# Patient Record
Sex: Male | Born: 1975 | Race: Black or African American | Hispanic: No | Marital: Married | Smoking: Former smoker
Health system: Southern US, Community
[De-identification: ages and names within clinical notes are randomized; demographics above are authoritative.]

## PROBLEM LIST (undated history)

## (undated) DIAGNOSIS — K649 Unspecified hemorrhoids: Secondary | ICD-10-CM

## (undated) DIAGNOSIS — R7611 Nonspecific reaction to tuberculin skin test without active tuberculosis: Secondary | ICD-10-CM

## (undated) HISTORY — DX: Unspecified hemorrhoids: K64.9

## (undated) HISTORY — DX: Nonspecific reaction to tuberculin skin test without active tuberculosis: R76.11

---

## 2005-07-26 ENCOUNTER — Emergency Department: Payer: Self-pay | Admitting: Emergency Medicine

## 2005-09-30 ENCOUNTER — Other Ambulatory Visit: Payer: Self-pay

## 2005-09-30 ENCOUNTER — Emergency Department: Payer: Self-pay | Admitting: Emergency Medicine

## 2010-12-26 ENCOUNTER — Emergency Department: Payer: Self-pay | Admitting: Emergency Medicine

## 2012-07-29 ENCOUNTER — Ambulatory Visit (INDEPENDENT_AMBULATORY_CARE_PROVIDER_SITE_OTHER): Payer: 59 | Admitting: Family Medicine

## 2012-07-29 ENCOUNTER — Encounter: Payer: Self-pay | Admitting: Family Medicine

## 2012-07-29 VITALS — BP 120/74 | HR 80 | Temp 98.1°F | Ht 71.0 in | Wt 229.8 lb

## 2012-07-29 DIAGNOSIS — Z131 Encounter for screening for diabetes mellitus: Secondary | ICD-10-CM

## 2012-07-29 DIAGNOSIS — Z13220 Encounter for screening for lipoid disorders: Secondary | ICD-10-CM

## 2012-07-29 DIAGNOSIS — Z0000 Encounter for general adult medical examination without abnormal findings: Secondary | ICD-10-CM

## 2012-07-29 LAB — CBC WITH DIFFERENTIAL/PLATELET
Basophils Absolute: 0 10*3/uL (ref 0.0–0.1)
HCT: 39.1 % (ref 39.0–52.0)
Hemoglobin: 12.9 g/dL — ABNORMAL LOW (ref 13.0–17.0)
Lymphs Abs: 2 10*3/uL (ref 0.7–4.0)
MCV: 92.7 fl (ref 78.0–100.0)
Monocytes Absolute: 0.4 10*3/uL (ref 0.1–1.0)
Neutro Abs: 3.4 10*3/uL (ref 1.4–7.7)
Platelets: 194 10*3/uL (ref 150.0–400.0)
RDW: 12.8 % (ref 11.5–14.6)

## 2012-07-29 LAB — BASIC METABOLIC PANEL
BUN: 12 mg/dL (ref 6–23)
CO2: 28 mEq/L (ref 19–32)
Chloride: 108 mEq/L (ref 96–112)
Glucose, Bld: 85 mg/dL (ref 70–99)
Potassium: 3.9 mEq/L (ref 3.5–5.1)
Sodium: 139 mEq/L (ref 135–145)

## 2012-07-29 NOTE — Progress Notes (Signed)
Nature conservation officer at [facility] [address] [place] [address] Phone: [phone] Fax: [phone]  Date:  07/29/2012   Name:  Michael Martin   DOB:  11-13-1975   MRN:  [id number] Gender: male Age: 37 y.o.  Primary Physician:  Hannah Beat, MD  Evaluating MD: Hannah Beat, MD  Chief Complaint: Establish Care   History of Present Illness:  Michael Martin is a 37 y.o. very pleasant male patient who presents with the following:  CPX:  Some intermittently blood in stool - more with pressure.  Has been going on for years.   Preventative Health Maintenance Visit:  Health Maintenance Summary Reviewed and updated, unless pt declines services.  Tobacco History Reviewed. 3 cigs a day Alcohol: No concerns, no excessive use Exercise Habits: Some activity, rec at least 30 mins 5 times a week STD concerns: no risk or activity to increase risk Drug Use: None Encouraged self-testicular check   Past Medical History, Surgical History, Social History, Family History, Problem List, Medications, and Allergies have been reviewed and updated if relevant.  No current outpatient prescriptions on file prior to visit.   No current facility-administered medications on file prior to visit.    Review of Systems:  General: Denies fever, chills, sweats. No significant weight loss. Eyes: Denies blurring,significant itching ENT: Denies earache, sore throat, and hoarseness. Cardiovascular: Denies chest pains, palpitations, dyspnea on exertion Respiratory: Denies cough, dyspnea at rest,wheeezing Breast: no concerns about lumps GI: Denies nausea, vomiting, diarrhea, constipation, change in bowel habits, abdominal pain. OCC BRBPR GU: Denies penile discharge, ED, urinary flow / outflow problems. No STD concerns. Musculoskeletal: Denies back pain, joint pain Derm: Denies rash, itching Neuro: Denies  paresthesias, frequent falls, frequent headaches Psych: Denies depression,  anxiety Endocrine: Denies cold intolerance, heat intolerance, polydipsia Heme: Denies enlarged lymph nodes Allergy: No hayfever   Physical Examination: BP 120/74  Pulse 80  Temp(Src) 98.1 F (36.7 C) (Oral)  Ht 5\' 11"  (1.803 m)  Wt 229 lb 12 oz (104.214 kg)  BMI 32.06 kg/m2  SpO2 97%   Wt Readings from Last 3 Encounters:  07/29/12 229 lb 12 oz (104.214 kg)    GEN: well developed, well nourished, no acute distress Eyes: conjunctiva and lids normal, PERRLA, EOMI ENT: TM clear, nares clear, oral exam WNL Neck: supple, no lymphadenopathy, no thyromegaly, no JVD Pulm: clear to auscultation and percussion, respiratory effort normal CV: regular rate and rhythm, S1-S2, no murmur, rub or gallop, no bruits, peripheral pulses normal and symmetric, no cyanosis, clubbing, edema or varicosities Chest: no scars, masses GI: soft, non-tender; no hepatosplenomegaly, masses; active bowel sounds all quadrants. HEMORRHOIDS SMALL GU: no hernia, testicular mass, penile discharge Lymph: no cervical, axillary or inguinal adenopathy MSK: gait normal, muscle tone and strength WNL, no joint swelling, effusions, discoloration, crepitus  SKIN: clear, good turgor, color WNL, no rashes, lesions, or ulcerations Neuro: normal mental status, normal strength, sensation, and motion Psych: alert; oriented to person, place and time, normally interactive and not anxious or depressed in appearance.   Assessment and Plan:  Routine general medical examination at a health care facility - Plan: CBC with Differential  Screening for lipoid disorders - Plan: LDL cholesterol, direct  Screening for diabetes mellitus - Plan: Basic metabolic panel  The patient's preventative maintenance and recommended screening tests for an annual wellness exam were reviewed in full today. Brought up to date unless services declined.  Counselled on the importance of diet, exercise, and its role in overall health and  mortality. The  patient's FH and SH was reviewed, including their home life, tobacco status, and drug and alcohol status.   Overall, doing well.  Orders Today:  Orders Placed This Encounter  Procedures  . Basic metabolic panel  . CBC with Differential  . LDL cholesterol, direct    Updated Medication List: (Includes new medications, updates to list, dose adjustments) No orders of the defined types were placed in this encounter.    Medications Discontinued: There are no discontinued medications.    Signed, Elpidio Galea. Aniyia Rane, MD 07/29/2012 2:14 PM

## 2012-07-30 ENCOUNTER — Encounter: Payer: Self-pay | Admitting: *Deleted

## 2012-07-30 LAB — LDL CHOLESTEROL, DIRECT: Direct LDL: 50.7 mg/dL

## 2012-11-28 ENCOUNTER — Other Ambulatory Visit: Payer: Self-pay

## 2014-04-16 ENCOUNTER — Emergency Department: Payer: Self-pay | Admitting: Emergency Medicine

## 2016-01-15 ENCOUNTER — Emergency Department: Payer: Self-pay

## 2016-01-15 ENCOUNTER — Encounter: Payer: Self-pay | Admitting: Medical Oncology

## 2016-01-15 ENCOUNTER — Emergency Department
Admission: EM | Admit: 2016-01-15 | Discharge: 2016-01-15 | Disposition: A | Discharge status: Home / Self Care | Payer: Self-pay | Attending: Emergency Medicine | Admitting: Emergency Medicine

## 2016-01-15 DIAGNOSIS — J329 Chronic sinusitis, unspecified: Secondary | ICD-10-CM | POA: Insufficient documentation

## 2016-01-15 DIAGNOSIS — R51 Headache: Secondary | ICD-10-CM

## 2016-01-15 DIAGNOSIS — F17210 Nicotine dependence, cigarettes, uncomplicated: Secondary | ICD-10-CM | POA: Insufficient documentation

## 2016-01-15 DIAGNOSIS — R519 Headache, unspecified: Secondary | ICD-10-CM

## 2016-01-15 MED ORDER — OXYMETAZOLINE HCL 0.05 % NA SOLN
2.0000 | Freq: Once | NASAL | Status: AC
  Administered 2016-01-15: 2 via NASAL
  Filled 2016-01-15: qty 15

## 2016-01-15 MED ORDER — KETOROLAC TROMETHAMINE 60 MG/2ML IM SOLN
60.0000 mg | Freq: Once | INTRAMUSCULAR | Status: AC
  Administered 2016-01-15: 60 mg via INTRAMUSCULAR

## 2016-01-15 MED ORDER — AMOXICILLIN-POT CLAVULANATE 875-125 MG PO TABS
1.0000 | ORAL_TABLET | Freq: Once | ORAL | Status: AC
  Administered 2016-01-15: 1 via ORAL
  Filled 2016-01-15: qty 1

## 2016-01-15 MED ORDER — AMOXICILLIN-POT CLAVULANATE 875-125 MG PO TABS: 1.0000 | ORAL_TABLET | Freq: Two times a day (BID) | ORAL | 0 refills | Status: AC

## 2016-01-15 MED ORDER — METOCLOPRAMIDE HCL 10 MG PO TABS
10.0000 mg | ORAL_TABLET | Freq: Once | ORAL | Status: AC
  Administered 2016-01-15: 10 mg via ORAL
  Filled 2016-01-15: qty 1

## 2016-01-15 NOTE — ED Triage Notes (Signed)
Pt reports headache x 3 days, no hx of migraines. Pt reports he feels like the left side of his face is swollen. Pt denies NV.

## 2016-01-15 NOTE — ED Notes (Signed)
Patient states that he has had a headache for the last 3 days. Described the pain as a constant pain like someone has hit him in the face. Patient states the pain is in the left cheek bone, around the left eye socket, left frontal area and back. Patient sat up to get jacket off and said the pain shot from the front to the back of his head. Patient states he has had some blurred vision and unsteady gait. Patient has not taken anything for the pain because he does not like to take medications. Patient denies sensitivity to light and sound. Has sensitivity to touch.

## 2016-01-15 NOTE — ED Provider Notes (Signed)
[facility] Emergency Department Provider Note  Time seen: 4:40 PM  I have reviewed the triage vital signs and the nursing notes.   HISTORY  Chief Complaint Headache    HPI Michael Martin is a 40 y.o. male with no past medical history who presents to the emergency department with a left-sided headache for the past 3 days. According to the patient for the past 3 days he has been expressing pain under his left eye, with occasional sharp pain shooting into his head. States this area is very tender to the touch. Denies any recent illnesses, fever, cough, congestion. Denies any weakness or numbness. Describes his symptoms currently as moderate but will occasionally have more significant shooting type pains.  Past Medical History:  Diagnosis Date  . Hemorrhoids   . 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    There are no active problems to display for this patient.   No past surgical history on file.  Prior to Admission medications   Not on File    No Known Allergies  No family history on file.  Social History Social History  Substance Use Topics  . Smoking status: Current Some Day Smoker    Packs/day: 0.20    Types: Cigarettes  . Smokeless tobacco: Never Used  . Alcohol use Yes    Review of Systems Constitutional: Negative for fever. Eyes: Negative for visual changes. Cardiovascular: Negative for chest pain. Respiratory: Negative for shortness of breath. Gastrointestinal: Negative for abdominal pain, states diarrhea earlier this week. Neurological: Moderate left-sided headache. Denies focal weakness or numbness. 10-point ROS otherwise negative.  ____________________________________________   PHYSICAL EXAM:  VITAL SIGNS: ED Triage Vitals  Enc Vitals Group     BP 01/15/16 1505 114/89     Pulse Rate 01/15/16 1505 86     Resp 01/15/16 1505 18     Temp 01/15/16 1505 98.2 F (36.8 C)     Temp Source 01/15/16 1505 Oral     SpO2 01/15/16 1505  98 %     Weight 01/15/16 1506 230 lb (104.3 kg)     Height 01/15/16 1506 5\' 10"  (1.778 m)     Head Circumference --      Peak Flow --      Pain Score 01/15/16 1506 10     Pain Loc --      Pain Edu? --      Excl. in GC? --     Constitutional: Alert and oriented. Well appearing and in no distress.Keeps eyes closed through most of the exam. Eyes: Normal exam, slight photophobia, 2-3 mm bilateral Perrl. Extraocular muscles intact. ENT   Head: Normocephalic and atraumatic.   Mouth/Throat: Mucous membranes are moist. Significant tenderness palpation over maxillary sinus, mild tenderness over ethmoid sinus, no tenderness over frontal sinus. Cardiovascular: Normal rate, regular rhythm. No murmurs, rubs, or gallops. Respiratory: Normal respiratory effort without tachypnea nor retractions. Breath sounds are clear  Gastrointestinal: Soft and nontender. No distention.  Musculoskeletal: Nontender with normal range of motion in all extremities.  Neurologic:  Normal speech and language. No gross focal neurologic deficits  Skin:  Skin is warm, dry and intact.  Psychiatric: Mood and affect are normal. Speech and behavior are normal.   ____________________________________________   RADIOLOGY  CT negative  ____________________________________________   INITIAL IMPRESSION / ASSESSMENT AND PLAN / ED COURSE  Pertinent labs & imaging results that were available during my care of the patient were reviewed by me and considered in  my medical decision making (see chart for details).  The patient presents to the emergency department with pain under his left eye, mild left headache. Patient has significant tenderness to percussion over the left maxillary sinus. Denies visual changes, pupils are equal round and reactive, extraocular muscles intact. CT scan of head is negative. Given the area of the patient's discomfort I highly suspect sinusitis as being the cause the patient's pain. We will dose  medications including Augmentin and closely monitor in the emergency department for symptom relief.  Patient states headache has completely resolved. We will discharge with Augmentin, and over-the-counter ibuprofen/Tylenol for discomfort. Patient agreeable to plan. ____________________________________________   FINAL CLINICAL IMPRESSION(S) / ED DIAGNOSES  Headache Sinusitis    Minna AntisKevin Roena Sassaman, MD 01/15/16 [phone]

## 2017-06-25 ENCOUNTER — Ambulatory Visit: Payer: Self-pay

## 2017-06-25 NOTE — Telephone Encounter (Signed)
Spoke with Spouse [place]a.  Pt has new pt appointment 6/26.  They are on their way to urgent care now

## 2017-06-25 NOTE — Telephone Encounter (Signed)
Pt. States he stated seeing blood in his stools 3 days ago. States the blood is dark red - is not in the stool but on top and blood in toilet water and when he wipes. Reports he also has noticed a "knot on his belly button" that comes and goes. States "it is going away now. No fever. Some loose stools. States he had this before and he was told it was hemorrhoids. No availability in office today. Pt. Will go to UC. Would like to be re-established as a pt.  Reason for Disposition . MODERATE rectal bleeding (small blood clots, passing blood without stool, or toilet water turns red)  Answer Assessment - Initial Assessment Questions 1. APPEARANCE of BLOOD: "What color is it?" "Is it passed separately, on the surface of the stool, or mixed in with the stool?"      Dark red - not in the the stool 2. AMOUNT: "How much blood was passed?"      Large 3. FREQUENCY: "How many times has blood been passed with the stools?"      3 days ago - 4 times 4. ONSET: "When was the blood first seen in the stools?" (Days or weeks)      3 days ago 5. DIARRHEA: "Is there also some diarrhea?" If so, ask: "How many diarrhea stools were passed in past 24 hours?"      Loose stools 6. CONSTIPATION: "Do you have constipation?" If so, "How bad is it?"     No 7. RECURRENT SYMPTOMS: "Have you had blood in your stools before?" If so, ask: "When was the last time?" and "What happened that time?"      Yes - maybe hemorrhoids  8. BLOOD THINNERS: "Do you take any blood thinners?" (e.g., Coumadin/warfarin, Pradaxa/dabigatran, aspirin)     No 9. OTHER SYMPTOMS: "Do you have any other symptoms?"  (e.g., abdominal pain, vomiting, dizziness, fever)     Knot at my naval  10. PREGNANCY: "Is there any chance you are pregnant?" "When was your last menstrual period?"       N/A  Protocols used: RECTAL BLEEDING-A-AH

## 2017-07-12 ENCOUNTER — Emergency Department
Admission: EM | Admit: 2017-07-12 | Discharge: 2017-07-12 | Disposition: A | Discharge status: Home / Self Care | Payer: BC Managed Care – PPO | Attending: Emergency Medicine | Admitting: Emergency Medicine

## 2017-07-12 ENCOUNTER — Encounter: Payer: Self-pay | Admitting: Emergency Medicine

## 2017-07-12 ENCOUNTER — Emergency Department: Payer: BC Managed Care – PPO

## 2017-07-12 ENCOUNTER — Other Ambulatory Visit: Payer: Self-pay

## 2017-07-12 DIAGNOSIS — Z87891 Personal history of nicotine dependence: Secondary | ICD-10-CM | POA: Insufficient documentation

## 2017-07-12 DIAGNOSIS — K529 Noninfective gastroenteritis and colitis, unspecified: Secondary | ICD-10-CM | POA: Insufficient documentation

## 2017-07-12 DIAGNOSIS — R109 Unspecified abdominal pain: Secondary | ICD-10-CM | POA: Diagnosis present

## 2017-07-12 LAB — CBC
HEMATOCRIT: 45.8 % (ref 40.0–52.0)
HEMOGLOBIN: 15.5 g/dL (ref 13.0–18.0)
MCH: 30.6 pg (ref 26.0–34.0)
MCHC: 33.8 g/dL (ref 32.0–36.0)
MCV: 90.4 fL (ref 80.0–100.0)
Platelets: 230 10*3/uL (ref 150–440)
RBC: 5.07 MIL/uL (ref 4.40–5.90)
RDW: 12.7 % (ref 11.5–14.5)
WBC: 9.6 10*3/uL (ref 3.8–10.6)

## 2017-07-12 LAB — COMPREHENSIVE METABOLIC PANEL
ALT: 22 U/L (ref 17–63)
ANION GAP: 8 (ref 5–15)
AST: 25 U/L (ref 15–41)
Albumin: 4.6 g/dL (ref 3.5–5.0)
Alkaline Phosphatase: 46 U/L (ref 38–126)
BUN: 20 mg/dL (ref 6–20)
CO2: 21 mmol/L — ABNORMAL LOW (ref 22–32)
Calcium: 9.4 mg/dL (ref 8.9–10.3)
Chloride: 105 mmol/L (ref 101–111)
Creatinine, Ser: 0.82 mg/dL (ref 0.61–1.24)
GFR calc Af Amer: >60 mL/min (ref >60)
GFR calc non Af Amer: >60 mL/min (ref >60)
GLUCOSE: 110 mg/dL — AB (ref 65–99)
POTASSIUM: 3.8 mmol/L (ref 3.5–5.1)
Sodium: 134 mmol/L — ABNORMAL LOW (ref 135–145)
TOTAL PROTEIN: 7.9 g/dL (ref 6.5–8.1)
Total Bilirubin: 0.9 mg/dL (ref 0.3–1.2)

## 2017-07-12 LAB — URINALYSIS, COMPLETE (UACMP) WITH MICROSCOPIC
BACTERIA UA: NONE SEEN
BILIRUBIN URINE: NEGATIVE
Glucose, UA: NEGATIVE mg/dL
HGB URINE DIPSTICK: NEGATIVE
Ketones, ur: NEGATIVE mg/dL
LEUKOCYTES UA: NEGATIVE
Nitrite: NEGATIVE
PROTEIN: NEGATIVE mg/dL
Specific Gravity, Urine: 1.027 (ref 1.005–1.030)
pH: 5 (ref 5.0–8.0)

## 2017-07-12 LAB — LIPASE, BLOOD: Lipase: 28 U/L (ref 11–51)

## 2017-07-12 LAB — TROPONIN I

## 2017-07-12 MED ORDER — SODIUM CHLORIDE 0.9 % IV BOLUS
1000.0000 mL | Freq: Once | INTRAVENOUS | Status: AC
Start: 2017-07-12 — End: 2017-07-12
  Administered 2017-07-12: 1000 mL via INTRAVENOUS

## 2017-07-12 MED ORDER — CIPROFLOXACIN HCL 500 MG PO TABS: 500.0000 mg | ORAL_TABLET | Freq: Two times a day (BID) | ORAL | 0 refills | Status: AC

## 2017-07-12 MED ORDER — ONDANSETRON HCL 4 MG/2ML IJ SOLN
INTRAMUSCULAR | Status: AC
  Filled 2017-07-12: qty 2

## 2017-07-12 MED ORDER — MORPHINE SULFATE (PF) 2 MG/ML IV SOLN
2.0000 mg | Freq: Once | INTRAVENOUS | Status: AC
  Administered 2017-07-12: 2 mg via INTRAVENOUS

## 2017-07-12 MED ORDER — IOPAMIDOL (ISOVUE-370) INJECTION 76%
100.0000 mL | Freq: Once | INTRAVENOUS | Status: AC | PRN
  Administered 2017-07-12: 100 mL via INTRAVENOUS

## 2017-07-12 MED ORDER — ONDANSETRON HCL 4 MG/2ML IJ SOLN
4.0000 mg | Freq: Once | INTRAMUSCULAR | Status: AC
  Administered 2017-07-12: 4 mg via INTRAVENOUS

## 2017-07-12 MED ORDER — MORPHINE SULFATE (PF) 2 MG/ML IV SOLN
INTRAVENOUS | Status: AC
  Filled 2017-07-12: qty 1

## 2017-07-12 NOTE — ED Provider Notes (Signed)
[facility] Emergency Department Provider Note   First MD Initiated Contact with Patient 07/12/17 [phone]     (approximate)  I have reviewed the triage vital signs and the nursing notes.   HISTORY  Chief Complaint Abdominal Pain   HPI Michael LangtonRalph A Brockmann is a 42 y.o. male presents to the emergency department with history of mid abdominal discomfort as well as nausea vomiting diarrhea which began at 3 PM yesterday.  Patient states that he also had blood in his stool 2 weeks ago however that has resolved.  Patient denies any fever.  Patient denies any urinary symptoms.   Past Medical History:  Diagnosis Date  . Hemorrhoids   . 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    There are no active problems to display for this patient.   History reviewed. No pertinent surgical history.  Prior to Admission medications   Not on File    Allergies No known drug allergies No family history on file.  Social History Social History   Tobacco Use  . Smoking status: Former Smoker    Packs/day: 0.20    Types: Cigarettes  . Smokeless tobacco: Never Used  Substance Use Topics  . Alcohol use: Not Currently  . Drug use: No    Review of Systems Constitutional: No fever/chills Eyes: No visual changes. ENT: No sore throat. Cardiovascular: Denies chest pain. Respiratory: Denies shortness of breath. Gastrointestinal: Positive for abdominal pain nausea vomiting and diarrhea Genitourinary: Negative for dysuria. Musculoskeletal: Negative for neck pain.  Negative for back pain. Integumentary: Negative for rash. Neurological: Negative for headaches, focal weakness or numbness.   ____________________________________________   PHYSICAL EXAM:  VITAL SIGNS: ED Triage Vitals  Enc Vitals Group     BP 07/12/17 0056 118/80     Pulse Rate 07/12/17 0056 94     Resp 07/12/17 0056 18     Temp 07/12/17 0056 98.7 F (37.1 C)     Temp Source 07/12/17 0056 Oral     SpO2 07/12/17  0056 98 %     Weight 07/12/17 0045 106.6 kg (235 lb)     Height 07/12/17 0045 1.803 m (5\' 11" )     Head Circumference --      Peak Flow --      Pain Score 07/12/17 0045 8     Pain Loc --      Pain Edu? --      Excl. in GC? --     Constitutional: Alert and oriented. Well appearing and in no acute distress. Eyes: Conjunctivae are normal. Head: Atraumatic.Michael Martin. Mouth/Throat: Mucous membranes are moist.  Oropharynx non-erythematous. Neck: No stridor.   Cardiovascular: Normal rate, regular rhythm. Good peripheral circulation. Grossly normal heart sounds. Respiratory: Normal respiratory effort.  No retractions. Lungs CTAB. Gastrointestinal: Left lower quadrant tenderness to palpation.  No distention.   Musculoskeletal: No lower extremity tenderness nor edema. No gross deformities of extremities. Neurologic:  Normal speech and language. No gross focal neurologic deficits are appreciated.  Skin:  Skin is warm, dry and intact. No rash noted. Psychiatric: Mood and affect are normal. Speech and behavior are normal.  ____________________________________________   LABS (all labs ordered are listed, but only abnormal results are displayed)  Labs Reviewed  COMPREHENSIVE METABOLIC PANEL - Abnormal; Notable for the following components:      Result Value   Sodium 134 (*)    CO2 21 (*)    Glucose, Bld 110 (*)    All other components  within normal limits  URINALYSIS, COMPLETE (UACMP) WITH MICROSCOPIC - Abnormal; Notable for the following components:   Color, Urine YELLOW (*)    APPearance CLEAR (*)    All other components within normal limits  LIPASE, BLOOD  CBC  TROPONIN I  ED ECG REPORT I, Burden N Alyah Boehning, the attending physician, personally viewed and interpreted this ECG.   Date: 07/12/2017  EKG Time: 12:48 AM  Rate: 94  Rhythm: Normal sinus rhythm  Axis: Normal  Intervals: Normal  ST&T Change: None  __________________________________  RADIOLOGY I, Corydon N Mcgregor Tinnon, personally  viewed and evaluated these images (plain radiographs) as part of my medical decision making, as well as reviewing the written report by the radiologist.  ED MD interpretation: Findings consistent with possible enteritis per radiologist.  Official radiology report(s): Ct Abdomen Pelvis W Contrast  Result Date: 07/12/2017 CLINICAL DATA:  Mid abdominal pain and distention for 2 weeks. Bright red blood in the stools last week. Heavy sweats followed by periods of vomiting. EXAM: CT ABDOMEN AND PELVIS WITH CONTRAST TECHNIQUE: Multidetector CT imaging of the abdomen and pelvis was performed using the standard protocol following bolus administration of intravenous contrast. CONTRAST:  ISOVUE-370 IOPAMIDOL (ISOVUE-370) INJECTION 76% COMPARISON:  None. FINDINGS: Lower chest: Cluster of nodules in the left lung base adjacent to the hemidiaphragm measuring up to 9 mm in diameter. Some of the nodules are calcified. These may all be granulomas. Hepatobiliary: No focal liver abnormality is seen. No gallstones, gallbladder wall thickening, or biliary dilatation. Pancreas: Unremarkable. No pancreatic ductal dilatation or surrounding inflammatory changes. Spleen: Normal in size without focal abnormality. Adrenals/Urinary Tract: Adrenal glands are unremarkable. Kidneys are normal, without renal calculi, focal lesion, or hydronephrosis. Bladder is unremarkable. Stomach/Bowel: Stomach, small bowel, and colon are not abnormally distended. Under distention limits evaluation of bowel wall but there appears to be mild bowel wall thickening and hyperemia in the distal small bowel. This could indicate enteritis. The appendix is normal. Vascular/Lymphatic: No significant vascular findings are present. No enlarged abdominal or pelvic lymph nodes. Reproductive: Prostate gland is not enlarged. Other: No free air or free fluid in the abdomen. Periumbilical hernia containing fat. Musculoskeletal: No acute or significant osseous  findings. IMPRESSION: 1. Possible mild small bowel wall thickening may indicate enteritis. Under distention limits evaluation. 2. Cluster of nodules in the left lung base measuring up to 9 mm. Some of these are calcified and all may be granulomas. Non-contrast chest CT at 3-6 months is recommended. If the nodules are stable at time of repeat CT, then future CT at 18-24 months (from today's scan) is considered optional for low-risk patients, but is recommended for high-risk patients. This recommendation follows the consensus statement: Guidelines for Management of Incidental Pulmonary Nodules Detected on CT Images: From the Fleischner Society 2017; Radiology 2017; 284:228-243. 3. Small periumbilical hernia containing fat. Electronically Signed   By: Burman Nieves M.D.   On: 07/12/2017 04:30      Procedures   ____________________________________________   INITIAL IMPRESSION / ASSESSMENT AND PLAN / ED COURSE  As part of my medical decision making, I reviewed the following data within the electronic MEDICAL RECORD NUMBER   42 year old male presented with above-stated history and physical exam secondary to abdominal pain nausea vomiting and diarrhea.  Laboratory data unremarkable.  CT scan of the abdomen consistent with enteritis.  Patient does work in [employer] and as such concern for gastroenteritis as well as risk of transmitting the same.  Concern for possible  bacterial etiology given recent history of blood noted in the stool. ____________________________________________  FINAL CLINICAL IMPRESSION(S) / ED DIAGNOSES  Final diagnoses:  Gastroenteritis     MEDICATIONS GIVEN DURING THIS VISIT:  Medications  ondansetron (ZOFRAN) injection 4 mg (4 mg Intravenous Given 07/12/17 0301)  morphine 2 MG/ML injection 2 mg (2 mg Intravenous Given 07/12/17 0301)  sodium chloride 0.9 % bolus 1,000 mL (0 mLs Intravenous Stopped 07/12/17 0527)  iopamidol (ISOVUE-370) 76 % injection 100 mL  (100 mLs Intravenous Contrast Given 07/12/17 0359)     ED Discharge Orders    None       Note:  This document was prepared using Dragon voice recognition software and may include unintentional dictation errors.    Darci Current, MD 07/12/17 [phone]

## 2017-07-12 NOTE — ED Notes (Signed)
Reviewed discharge instructions, follow-up care, and prescriptions with patient. Patient verbalized understanding of all information reviewed. Patient stable, with no distress noted at this time.    

## 2017-07-12 NOTE — ED Notes (Signed)
Patient c/o mid abdominal pain and distension X 2 weeks. Patient reports bright red, blood stools last week. Patient denies hematuria. Patient reports "heavy sweats", followed immediately by periods of vomiting.

## 2017-07-12 NOTE — ED Notes (Signed)
ED Provider at bedside. 

## 2017-07-12 NOTE — ED Triage Notes (Addendum)
Patient ambulatory to triage with steady gait, without difficulty or distress noted; pt reports mid abd pain/swelling accomp by N/V/D since 3pm; wife reports approx 2wks ago had hematuria x wk

## 2017-07-18 ENCOUNTER — Ambulatory Visit: Payer: BC Managed Care – PPO | Admitting: Family Medicine

## 2017-07-18 ENCOUNTER — Encounter: Payer: Self-pay | Admitting: Family Medicine

## 2017-07-18 VITALS — BP 108/56 | HR 81 | Temp 98.4°F | Ht 69.75 in | Wt 240.0 lb

## 2017-07-18 DIAGNOSIS — R7309 Other abnormal glucose: Secondary | ICD-10-CM

## 2017-07-18 DIAGNOSIS — Z7689 Persons encountering health services in other specified circumstances: Secondary | ICD-10-CM | POA: Diagnosis not present

## 2017-07-18 DIAGNOSIS — K429 Umbilical hernia without obstruction or gangrene: Secondary | ICD-10-CM | POA: Diagnosis not present

## 2017-07-18 DIAGNOSIS — Z23 Encounter for immunization: Secondary | ICD-10-CM

## 2017-07-18 DIAGNOSIS — R918 Other nonspecific abnormal finding of lung field: Secondary | ICD-10-CM | POA: Diagnosis not present

## 2017-07-18 DIAGNOSIS — Z125 Encounter for screening for malignant neoplasm of prostate: Secondary | ICD-10-CM

## 2017-07-18 DIAGNOSIS — E669 Obesity, unspecified: Secondary | ICD-10-CM | POA: Diagnosis not present

## 2017-07-18 LAB — LIPID PANEL
Cholesterol: 104 mg/dL (ref 0–200)
HDL: 35 mg/dL — ABNORMAL LOW (ref >39.00)
LDL Cholesterol: 46 mg/dL (ref 0–99)
NonHDL: 68.61
Total CHOL/HDL Ratio: 3
Triglycerides: 112 mg/dL (ref 0.0–149.0)
VLDL: 22.4 mg/dL (ref 0.0–40.0)

## 2017-07-18 LAB — HEMOGLOBIN A1C: Hgb A1c MFr Bld: 5.2 % (ref 4.6–6.5)

## 2017-07-18 LAB — PSA: PSA: 0.58 ng/mL (ref 0.10–4.00)

## 2017-07-18 LAB — TSH: TSH: 0.74 u[IU]/mL (ref 0.35–4.50)

## 2017-07-18 NOTE — Patient Instructions (Signed)
It was a pleasure to meet you today! I look forward to partnering with you for your health care needs  Please go to the lab, I will notify you of results via MyChart  Please stop at the front desk and speak with a referral coordinator  Schedule a complete physical visit for 3-4 months, I will order your follow up chest CT at that time

## 2017-07-18 NOTE — Progress Notes (Signed)
Subjective:    Patient ID: Michael Martin, male    DOB: 1975/05/15, 42 y.o.   MRN: [id number]  HPI This is a 42 yo male who presents today to establish care. Accompanied by his wife. Was seen here previously many years ago. Has 8 children 908-40 yo. Five children still at home. Enjoys cooking. Works at [employer].    Last CPE- 07/2012 PSA- never Tdap- unknown Flu- annual Dental- regular Eye- not regular Exercise- walks a lot at work Sleep- 5-8 hours a night, feels rested, good energy level  Was seen 07/12/17 in ED for abdominal pain/diarrhea. Had CT which showed the following-  IMPRESSION: 1. Possible mild small bowel wall thickening may indicate enteritis. Under distention limits evaluation. 2. Cluster of nodules in the left lung base measuring up to 9 mm. Some of these are calcified and all may be granulomas. Non-contrast chest CT at 3-6 months is recommended. If the nodules are stable at time of repeat CT, then future CT at 18-24 months (from today's scan) is considered optional for low-risk patients, but is recommended for high-risk patients. This recommendation follows the consensus statement: Guidelines for Management of Incidental Pulmonary Nodules Detected on CT Images: From the Fleischner Society 2017; Radiology 2017; 284:228-243. 3. Small periumbilical hernia containing fat. He had negative troponin, UA, cmet, CBC and lipase. He was given Cipro for gastroenteritis and states he is feeling better and has not had any side effects from medication.   Bulge of abdomen- has noticed for awhile, seems to be getting bigger, sore/tender.   Chest pain- occasional soreness left side of chest and arm pain, notices when he is lying down. Occurs every month or two. Can last for hours. No associated neck pain, SOB, diaphoresis, nausea/vomiting. Never occurs with exertion.   No headache, dizziness, cough, SOB, hematuria, dysuria, frequency. Has had some distant blood in stool. History of  hemorrhoids.   Past Medical History:  Diagnosis Date  . Hemorrhoids   . 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   No past surgical history on file. Family History  Problem Relation Age of Onset  . Hyperlipidemia Mother   . Hypertension Mother   . Heart disease Mother   . Diabetes Mother   . Kidney disease Mother    Social History   Tobacco Use  . Smoking status: Former Smoker    Packs/day: 0.20    Types: Cigarettes  . Smokeless tobacco: Never Used  Substance Use Topics  . Alcohol use: Not Currently  . Drug use: No        Review of Systems Per HPI    Objective:   Physical Exam  Constitutional: He is oriented to person, place, and time. He appears well-developed and well-nourished. No distress.  HENT:  Head: Normocephalic and atraumatic.  Mouth/Throat: Oropharynx is clear and moist.  Eyes: Pupils are equal, round, and reactive to light. Conjunctivae are normal.  Neck: Normal range of motion. Neck supple.  Cardiovascular: Normal rate, regular rhythm and normal heart sounds.  Pulmonary/Chest: Effort normal and breath sounds normal.  Abdominal: Soft. Bowel sounds are normal. He exhibits no distension and no mass. There is tenderness (mild generalized). There is no rebound and no guarding. A hernia (umbilical, reducible. ) is present.  Musculoskeletal: He exhibits no edema.  Neurological: He is alert and oriented to person, place, and time.  Skin: Skin is warm and dry. He is not diaphoretic.  Psychiatric: He has a normal mood and affect. His  behavior is normal. Judgment and thought content normal.  Vitals reviewed.    BP (!) 108/56 (BP Location: Left Arm, Patient Position: Sitting, Cuff Size: Large)   Pulse 81   Temp 98.4 F (36.9 C) (Oral)   Ht 5' 9.75" (1.772 m)   Wt 240 lb (108.9 kg)   SpO2 96%   BMI 34.68 kg/m  Wt Readings from Last 3 Encounters:  07/18/17 240 lb (108.9 kg)  07/12/17 235 lb (106.6 kg)  01/15/16 230 lb (104.3 kg)         Assessment &  Plan:  1. Encounter to establish care - reviewed available records  2. Umbilical hernia without obstruction and without gangrene - ER precautions reviewed - per his request, refer to general surgery - Ambulatory referral to General Surgery  3. Obesity (BMI 30.0-34.9) - Lipid Panel - Hemoglobin A1c - TSH  4. Screening for prostate cancer - PSA  5. Elevated glucose - Hemoglobin A1c  6. Need for Tdap vaccination - Tdap vaccine greater than or equal to 7yo IM  7. Multiple pulmonary nodules determined by computed tomography of lung - discussed CT findings with patient and his wife - follow up in 3 months for CPE, will order repeat CT chest at that time   Olean Ree, FNP-BC  [facility] at [facility], [facility]  07/18/2017 1:28 PM

## 2017-07-25 IMAGING — CT CT HEAD W/O CM
3 series · 15 of 46 positions shown, 18 images · non-contrast
Comparison: None.

CLINICAL DATA: Headaches

EXAM:
CT HEAD WITHOUT CONTRAST
TECHNIQUE: Contiguous axial images were obtained from the base of the skull
through the vertex without intravenous contrast.

[Series 2: head wo · axial · 0.41mm/px · z∈[-64,+56]mm · 9 of 29 slices shown, 12 images]
[im 3/29  brain]
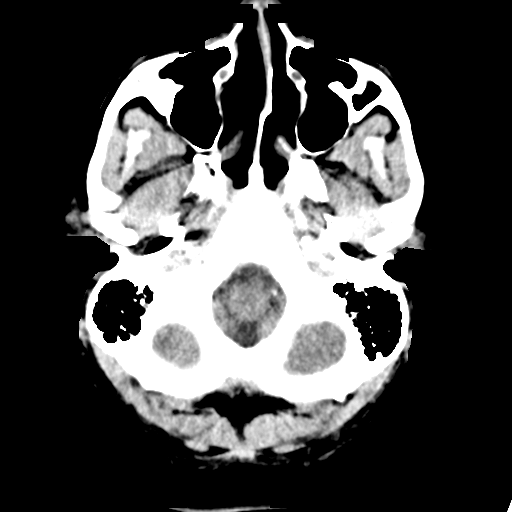
[im 3/29  bone]
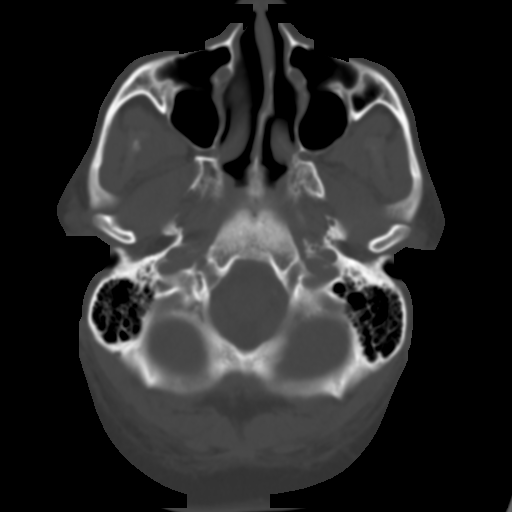
[im 6/29  brain]
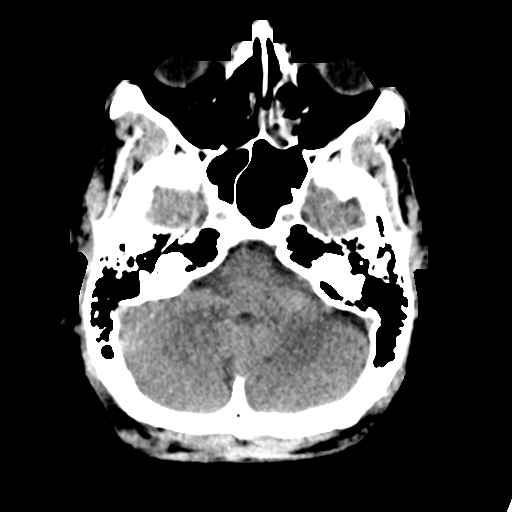
[im 9/29  brain]
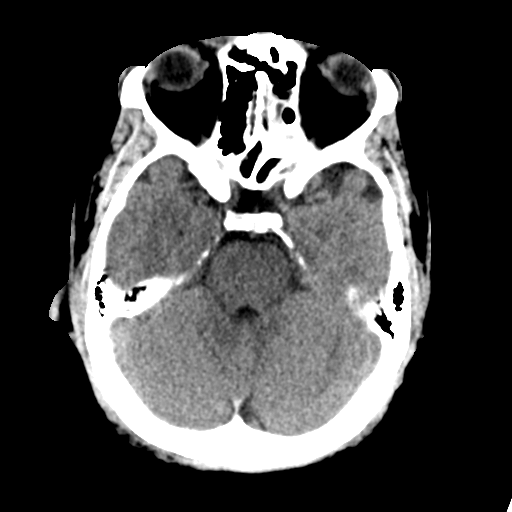
[im 12/29  brain]
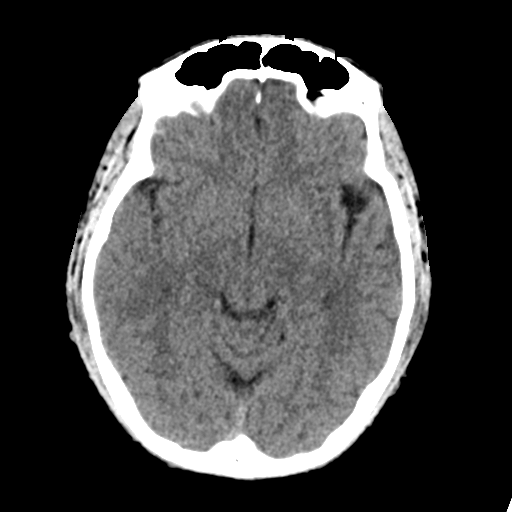
[im 15/29  brain]
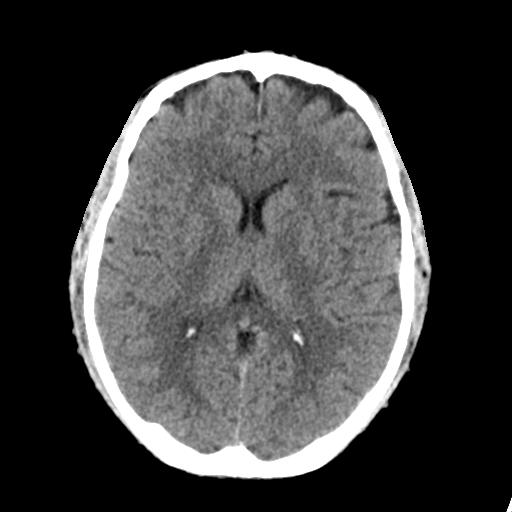
[im 15/29  bone]
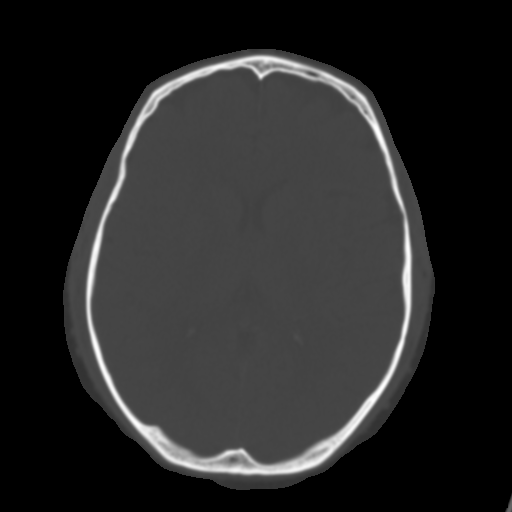
[im 18/29  brain]
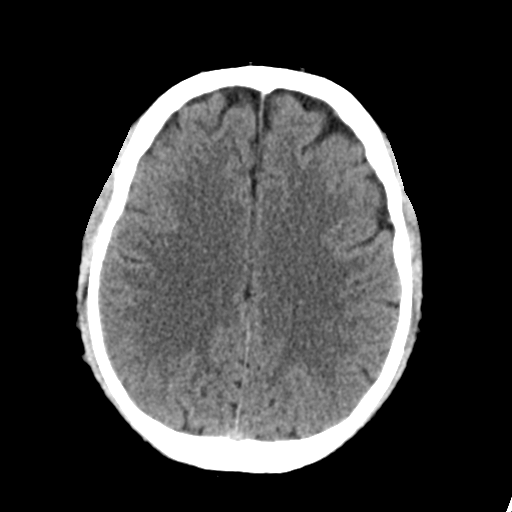
[im 21/29  brain]
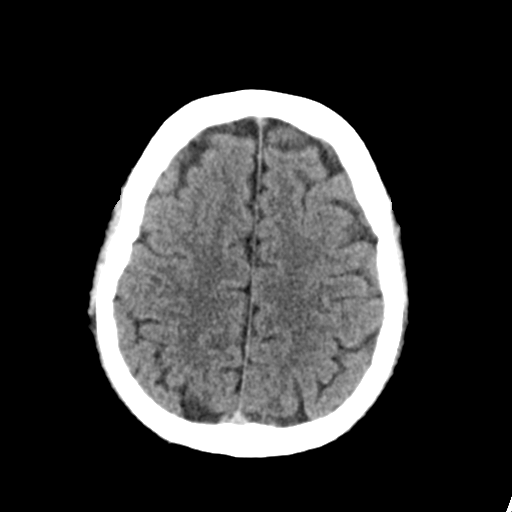
[im 24/29  brain]
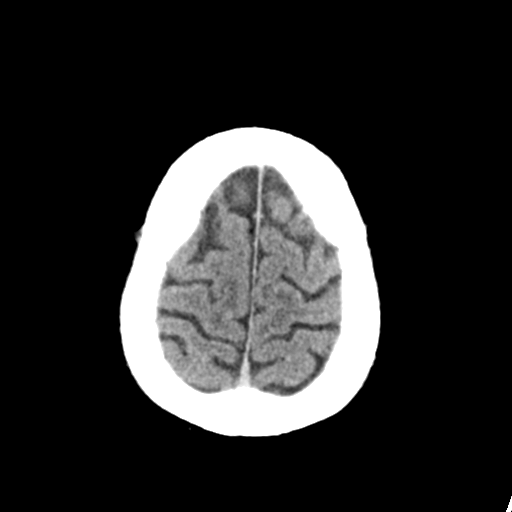
[im 27/29  brain]
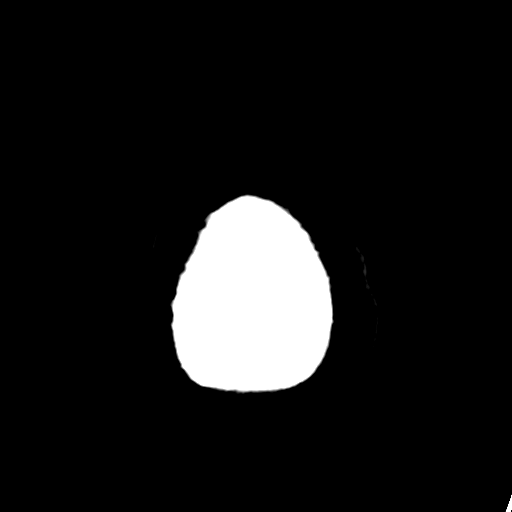
[im 27/29  bone]
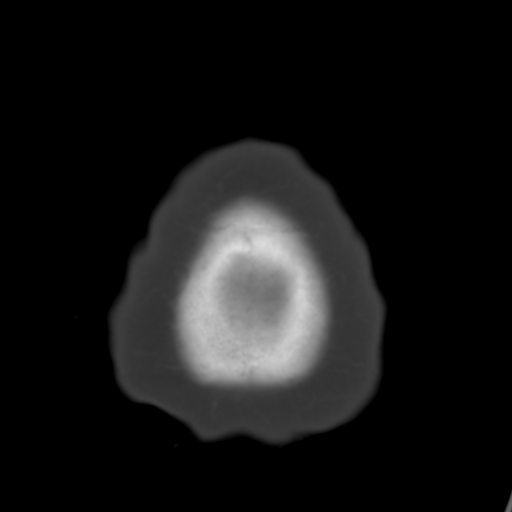

[Series 4: coronal soft tissue · coronal · 0.27mm/px · 3 of 67 slices shown]
[im 23/67  brain]
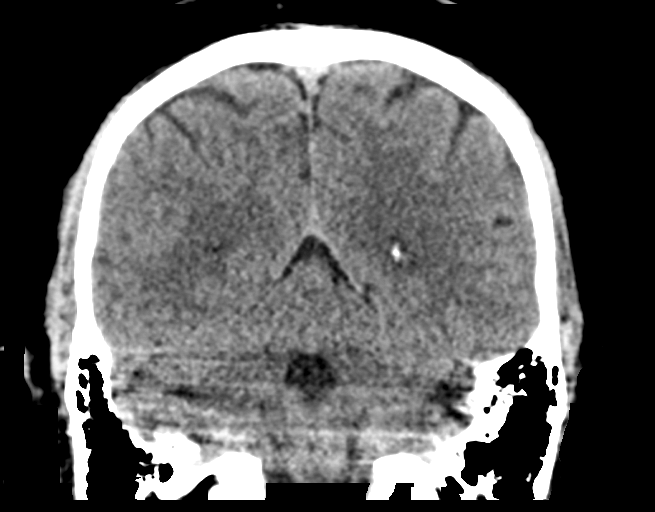
[im 30/67  brain]
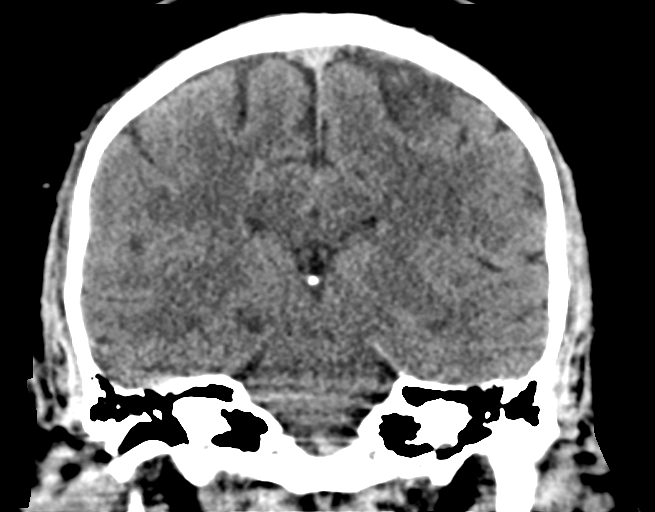
[im 37/67  brain]
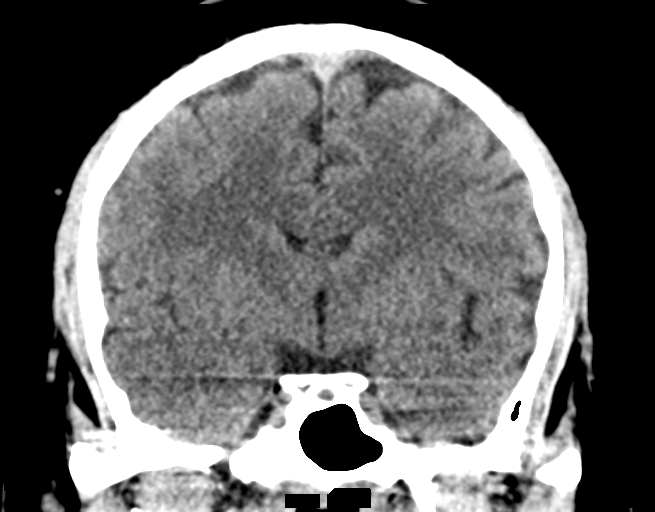

[Series 5: sagittal soft tissue · sagittal · 0.28mm/px · 3 of 53 slices shown]
[im 18/53  brain]
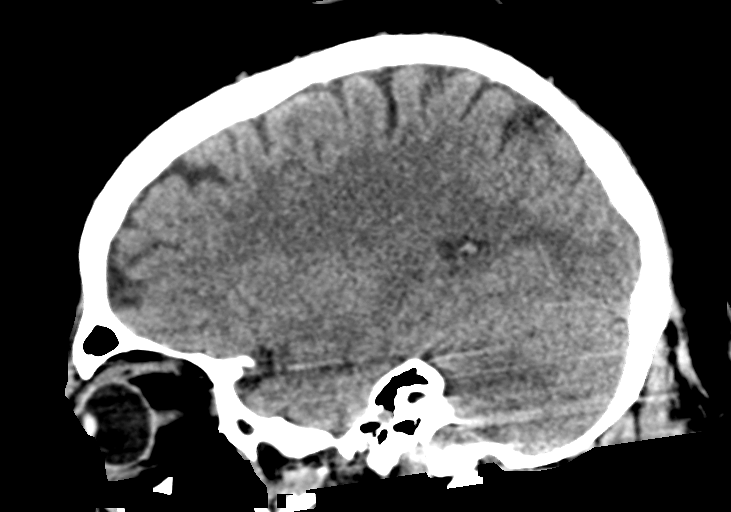
[im 27/53  brain]
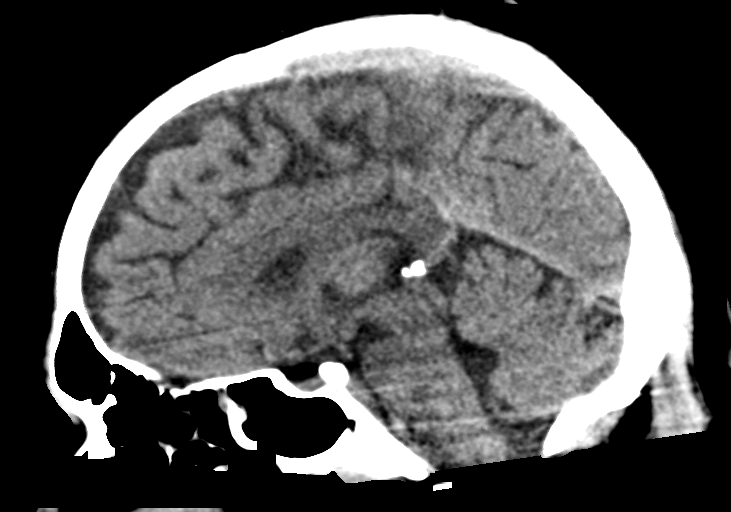
[im 35/53  brain]
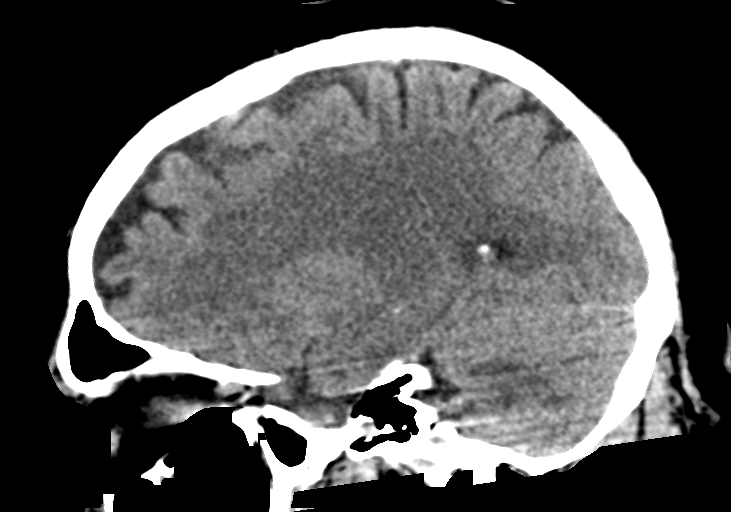

[15 of 46 positions shown; findings below may reference images not displayed]

FINDINGS: Brain: No evidence of acute infarction, hemorrhage, hydrocephalus,
extra-axial collection or mass lesion/mass effect.

Vascular: No hyperdense vessel or unexpected calcification.

Skull: Normal. Negative for fracture or focal lesion.

Sinuses/Orbits: No acute finding.

Other: None.
IMPRESSION: No acute intracranial abnormality noted.

## 2017-10-08 ENCOUNTER — Encounter: Payer: BC Managed Care – PPO | Admitting: Family Medicine

## 2017-10-08 DIAGNOSIS — Z0289 Encounter for other administrative examinations: Secondary | ICD-10-CM

## 2017-10-10 ENCOUNTER — Encounter: Payer: Self-pay | Admitting: Family Medicine

## 2017-10-10 ENCOUNTER — Ambulatory Visit (INDEPENDENT_AMBULATORY_CARE_PROVIDER_SITE_OTHER): Payer: BC Managed Care – PPO | Admitting: Family Medicine

## 2017-10-10 VITALS — BP 112/70 | HR 70 | Temp 97.6°F | Ht 69.75 in | Wt 236.2 lb

## 2017-10-10 DIAGNOSIS — R918 Other nonspecific abnormal finding of lung field: Secondary | ICD-10-CM | POA: Diagnosis not present

## 2017-10-10 DIAGNOSIS — Z23 Encounter for immunization: Secondary | ICD-10-CM

## 2017-10-10 DIAGNOSIS — Z0000 Encounter for general adult medical examination without abnormal findings: Secondary | ICD-10-CM

## 2017-10-10 DIAGNOSIS — E669 Obesity, unspecified: Secondary | ICD-10-CM | POA: Diagnosis not present

## 2017-10-10 DIAGNOSIS — R0789 Other chest pain: Secondary | ICD-10-CM

## 2017-10-10 DIAGNOSIS — E66811 Obesity, class 1: Secondary | ICD-10-CM

## 2017-10-10 NOTE — Progress Notes (Signed)
Subjective:    Patient ID: Michael Martin, male    DOB: 1975/04/06, 41 y.o.   MRN: [id number]  HPI This is a 42 yo male who presents today for CPE. Had labs drawn at previous visit 3 months ago.   Last CPE- 07/2012 PSA- never Tdap- unknown Flu- annual Dental- regular Eye- not regular Exercise- walks a lot at work Sleep- 5-8 hours a night, feels rested, good energy level  Past Medical History:  Diagnosis Date  . Hemorrhoids   . 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   No past surgical history on file. Family History  Problem Relation Age of Onset  . Hyperlipidemia Mother   . Hypertension Mother   . Heart disease Mother   . Diabetes Mother   . Kidney disease Mother    Social History   Tobacco Use  . Smoking status: Former Smoker    Packs/day: 0.20    Types: Cigarettes  . Smokeless tobacco: Never Used  Substance Use Topics  . Alcohol use: Not Currently  . Drug use: No      Review of Systems  Constitutional: Negative.   HENT: Positive for congestion (change of season).   Eyes: Negative.   Respiratory: Positive for chest tightness (left side, comes and goes, pain to right arm, feels like a "tension." Notices with driving. Occurs 1-2 times a day. Lasts about 5 minutes. No nausea or sweating. ). Negative for shortness of breath.   Cardiovascular: Negative for palpitations and leg swelling.  Gastrointestinal: Negative.   Endocrine: Negative.   Genitourinary: Negative.   Musculoskeletal:       Ankle bothers him some with driving.   Skin: Negative.   Allergic/Immunologic: Positive for environmental allergies.  Neurological: Negative.   Hematological: Negative.   Psychiatric/Behavioral: Negative for dysphoric mood. Sleep disturbance: 6-7 hours a night, feels rested. The patient is not nervous/anxious.        Objective:   Physical Exam Physical Exam  Constitutional: He is oriented to person, place, and time. He appears well-developed and well-nourished.  HENT:    Head: Normocephalic and atraumatic.  Right Ear: External ear normal.  Left Ear: External ear normal.  Nose: Nose normal.  Mouth/Throat: Oropharynx is clear and moist.  Eyes: Conjunctivae are normal. Pupils are equal, round, and reactive to light.  Neck: Normal range of motion. Neck supple.  Cardiovascular: Normal rate, regular rhythm, normal heart sounds and intact distal pulses.   Pulmonary/Chest: Effort normal and breath sounds normal.  Abdominal: Soft. Bowel sounds are normal. Small reducible umbilical hernia. Hernia confirmed negative in the right inguinal area and confirmed negative in the left inguinal area.  Genitourinary: Testes normal and penis normal. Circumcised.  Musculoskeletal: Normal range of motion. He exhibits no edema or tenderness.       Cervical back: Normal.       Thoracic back: Normal.       Lumbar back: Normal.  Lymphadenopathy:    He has no cervical adenopathy.       Right: No inguinal adenopathy present.       Left: No inguinal adenopathy present.  Neurological: He is alert and oriented to person, place, and time. He has normal reflexes.  Skin: Skin is warm and dry.  Psychiatric: He has a normal mood and affect. His behavior is normal. Judgment normal.  Vitals reviewed.     BP 112/70   Pulse 70   Temp 97.6 F (36.4 C) (Oral)   Ht 5' 9.75" (1.772  m)   Wt 236 lb 4 oz (107.2 kg)   SpO2 95%   BMI 34.14 kg/m  Wt Readings from Last 3 Encounters:  10/10/17 236 lb 4 oz (107.2 kg)  07/18/17 240 lb (108.9 kg)  07/12/17 235 lb (106.6 kg)   EKG- NSR, rate 66, pr 146, qt- 356    Assessment & Plan:  1. Annual physical exam - Discussed and encouraged healthy lifestyle choices- adequate sleep, regular exercise, stress management and healthy food choices.   2. Sensation of chest tightness - unclear etiology, EKG nml in office today, will refer to cardiology for additional testing - EKG 12-Lead - Ambulatory referral to Cardiology  3. Abnormal findings on  diagnostic imaging of lung - CT Chest Wo Contrast; Future  4. Multiple pulmonary nodules determined by computed tomography of lung - CT Chest Wo Contrast; Future  5. Obesity (BMI 30.0-34.9) - has lost a few pounds, encouraged continued efforts  - follow up in 1 year  Olean Reeeborah Gessner, FNP-BC  [facility], [facility]  10/10/2017 9:58 AM

## 2017-10-10 NOTE — Patient Instructions (Signed)
Good to see you today, keep up the good work on your weight loss  Please stop at the desk to see a referrals coordinator  Please schedule your annual exam for next year  Keeping you healthy  Get these tests  Blood pressure- Have your blood pressure checked once a year by your healthcare provider.  Normal blood pressure is 120/80.  Weight- Have your body mass index (BMI) calculated to screen for obesity.  BMI is a measure of body fat based on height and weight. You can also calculate your own BMI at https://www.west-esparza.com/www.[email or web address]/bmi/.  Cholesterol- Have your cholesterol checked regularly starting at age 235, sooner may be necessary if you have diabetes, high blood pressure, if a family member developed heart diseases at an early age or if you smoke.   Chlamydia, HIV, and other sexual transmitted disease- Get screened each year until the age of 40 then within three months of each new sexual partner.  Diabetes- Have your blood sugar checked regularly if you have high blood pressure, high cholesterol, a family history of diabetes or if you are overweight.  Get these vaccines  Flu shot- Every fall.  Tetanus shot- Every 10 years.  Menactra- Single dose; prevents meningitis.  Take these steps  Don't smoke- If you do smoke, ask your healthcare provider about quitting. For tips on how to quit, go to www.smokefree.gov or call 1-800-QUIT-NOW.  Be physically active- Exercise 5 days a week for at least 30 minutes.  If you are not already physically active start slow and gradually work up to 30 minutes of moderate physical activity.  Examples of moderate activity include walking briskly, mowing the yard, dancing, swimming bicycling, etc.  Eat a healthy diet- Eat a variety of healthy foods such as fruits, vegetables, low fat milk, low fat cheese, yogurt, lean meats, poultry, fish, beans, tofu, etc.  For more information on healthy eating, go to www.thenutritionsource.org  Drink alcohol in moderation-  Limit alcohol intake two drinks or less a day.  Never drink and drive.  Dentist- Brush and floss teeth twice daily; visit your dentis twice a year.  Depression-Your emotional health is as important as your physical health.  If you're feeling down, losing interest in things you normally enjoy please talk with your healthcare provider.  Gun Safety- If you keep a gun in your home, keep it unloaded and with the safety lock on.  Bullets should be stored separately.  Helmet use- Always wear a helmet when riding a motorcycle, bicycle, rollerblading or skateboarding.  Safe sex- If you may be exposed to a sexually transmitted infection, use a condom  Seat belts- Seat bels can save your life; always wear one.  Smoke/Carbon Monoxide detectors- These detectors need to be installed on the appropriate level of your home.  Replace batteries at least once a year.  Skin Cancer- When out in the sun, cover up and use sunscreen SPF 15 or higher.  Violence- If anyone is threatening or hurting you, please tell your healthcare provider.

## 2017-10-25 ENCOUNTER — Ambulatory Visit: Payer: BC Managed Care – PPO | Attending: Family Medicine

## 2017-11-15 NOTE — Addendum Note (Signed)
Addended by: Desmond Dike on: 11/15/2017 11:23 AM   Modules accepted: Orders

## 2017-11-23 ENCOUNTER — Ambulatory Visit: Admission: RE | Admit: 2017-11-23 | Payer: BC Managed Care – PPO | Source: Ambulatory Visit

## 2017-12-04 DIAGNOSIS — R079 Chest pain, unspecified: Secondary | ICD-10-CM | POA: Insufficient documentation

## 2017-12-04 NOTE — Progress Notes (Deleted)
No Show

## 2017-12-06 ENCOUNTER — Ambulatory Visit: Payer: BC Managed Care – PPO | Admitting: Cardiovascular Disease

## 2017-12-07 ENCOUNTER — Encounter: Payer: Self-pay | Admitting: Cardiovascular Disease

## 2017-12-31 ENCOUNTER — Telehealth: Payer: Self-pay | Admitting: Family Medicine

## 2017-12-31 NOTE — Telephone Encounter (Signed)
Please call patient and tell him it is important for him to follow up his lung nodules with a repeat cat scan. Please find out if he is willing to have the test so I can reorder it.

## 2018-01-07 NOTE — Telephone Encounter (Signed)
See message below °

## 2018-01-07 NOTE — Telephone Encounter (Signed)
Called and left detailed message on patients VM. (verified on DPR)

## 2018-01-09 ENCOUNTER — Other Ambulatory Visit: Payer: Self-pay | Admitting: Family Medicine

## 2018-01-09 DIAGNOSIS — R918 Other nonspecific abnormal finding of lung field: Secondary | ICD-10-CM

## 2018-01-09 NOTE — Telephone Encounter (Signed)
Order placed

## 2018-01-09 NOTE — Telephone Encounter (Signed)
Called and spoke with patient he is willing to have CAT scan and would [place] to have [place] in [place] if possible. Understanding verbalized nothing further needed at this time.

## 2018-01-14 ENCOUNTER — Other Ambulatory Visit: Payer: Self-pay | Admitting: Family Medicine

## 2018-01-22 ENCOUNTER — Ambulatory Visit
Admission: RE | Admit: 2018-01-22 | Discharge: 2018-01-22 | Disposition: A | Discharge status: Home / Self Care | Payer: BC Managed Care – PPO | Source: Ambulatory Visit | Attending: Family Medicine | Admitting: Family Medicine

## 2018-01-22 DIAGNOSIS — R918 Other nonspecific abnormal finding of lung field: Secondary | ICD-10-CM

## 2018-10-16 ENCOUNTER — Encounter: Payer: BC Managed Care – PPO | Admitting: Family Medicine

## 2018-10-16 ENCOUNTER — Other Ambulatory Visit: Payer: Self-pay

## 2018-10-16 DIAGNOSIS — Z0289 Encounter for other administrative examinations: Secondary | ICD-10-CM

## 2019-01-20 IMAGING — CT CT ABD-PELV W/ CM
2 of 5 series · 15 of 46 positions shown, 17 images · IV contrast (APPLIED)
Comparison: None.

CLINICAL DATA: Mid abdominal pain and distention for 2 weeks.
Bright red blood in the stools last week. Heavy sweats followed by
periods of vomiting.

EXAM:
CT ABDOMEN AND PELVIS WITH CONTRAST
TECHNIQUE: Multidetector CT imaging of the abdomen and pelvis was performed
using the standard protocol following bolus administration of
intravenous contrast.
CONTRAST:  100mL [id number] IOPAMIDOL ([id number]) INJECTION 76%

[Series 2: routine abd/pel with · axial · 0.82mm/px · z∈[-1023,-588]mm · 12 of 99 slices shown, 14 images]
[im 6/99  soft-tissue]
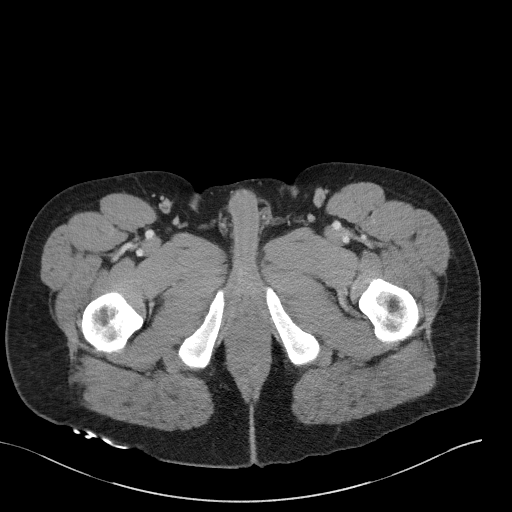
[im 6/99  bone]
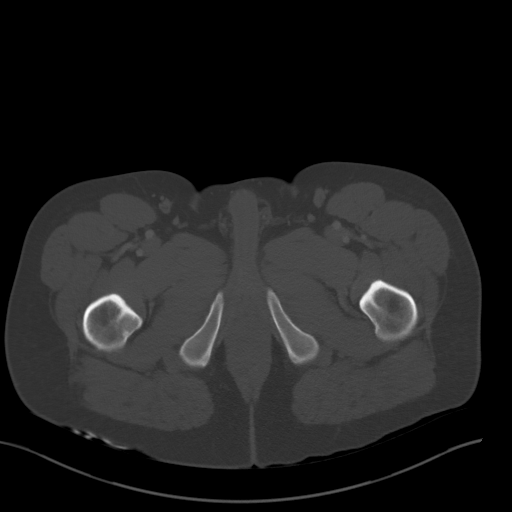
[im 17/99  soft-tissue]
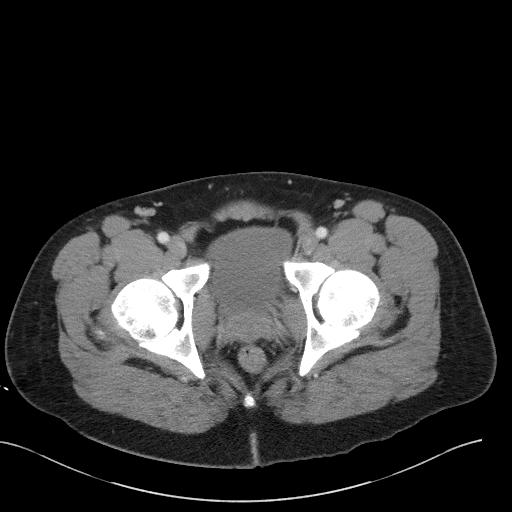
[im 22/99  soft-tissue]
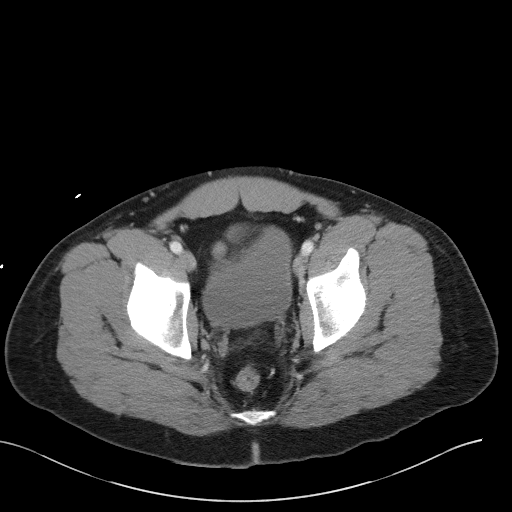
[im 28/99  soft-tissue]
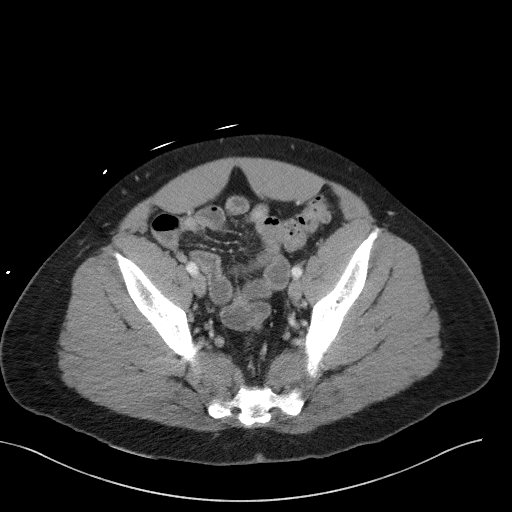
[im 39/99  soft-tissue]
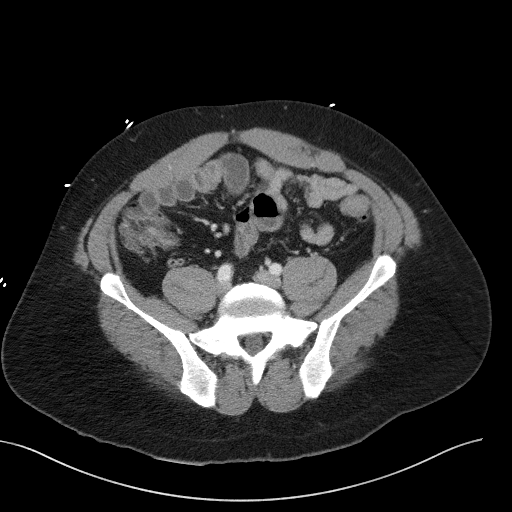
[im 44/99  soft-tissue]
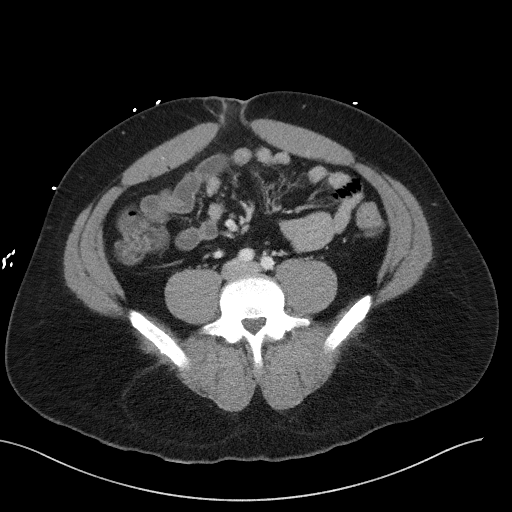
[im 55/99  soft-tissue]
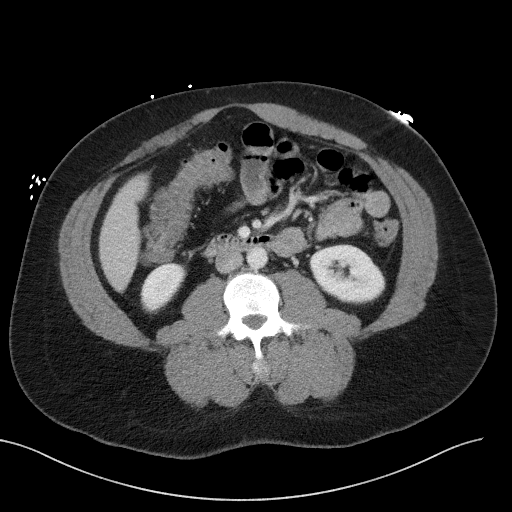
[im 60/99  soft-tissue]
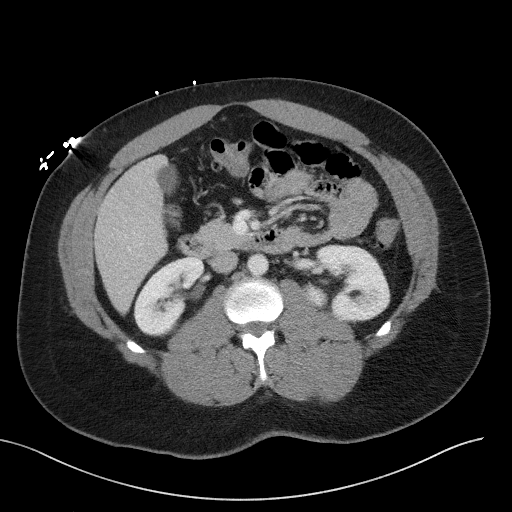
[im 71/99  soft-tissue]
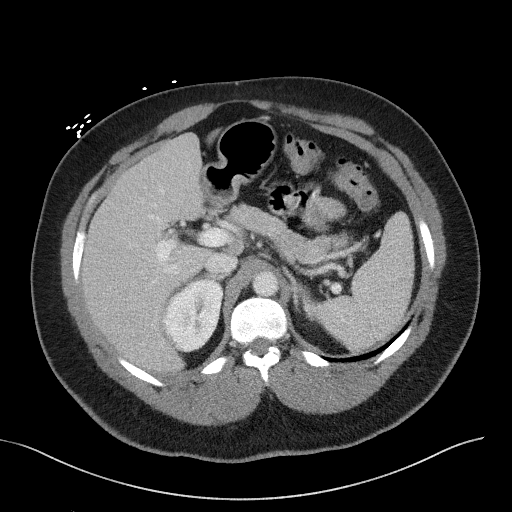
[im 71/99  bone]
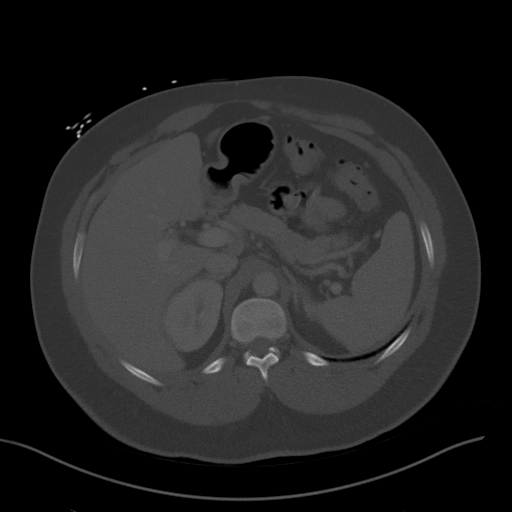
[im 77/99  soft-tissue]
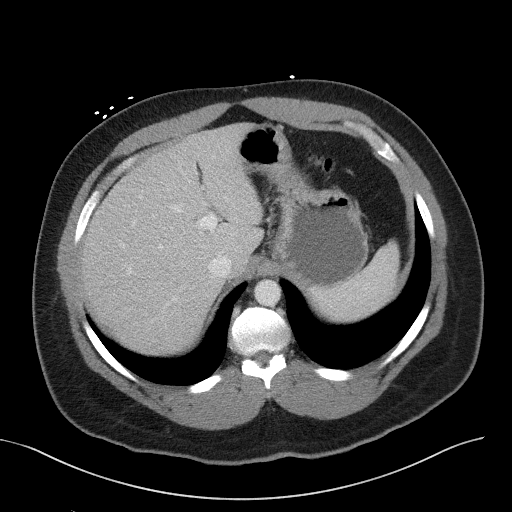
[im 82/99  soft-tissue]
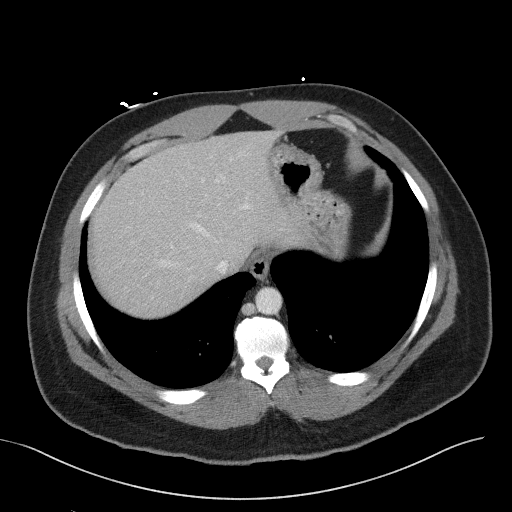
[im 93/99  soft-tissue]
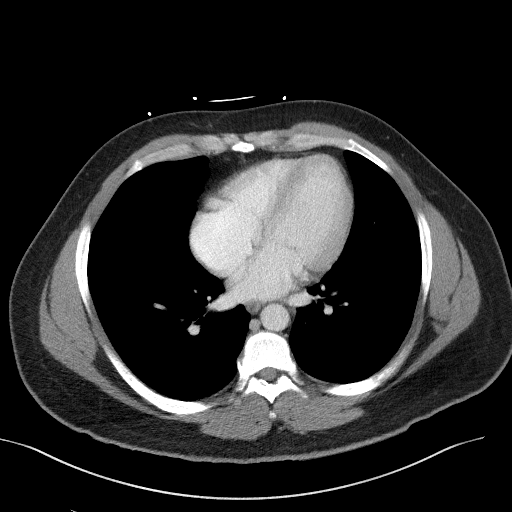

[Series 5: coronal st · coronal · 0.77mm/px · 3 of 94 slices shown]
[im 32/94  soft-tissue]
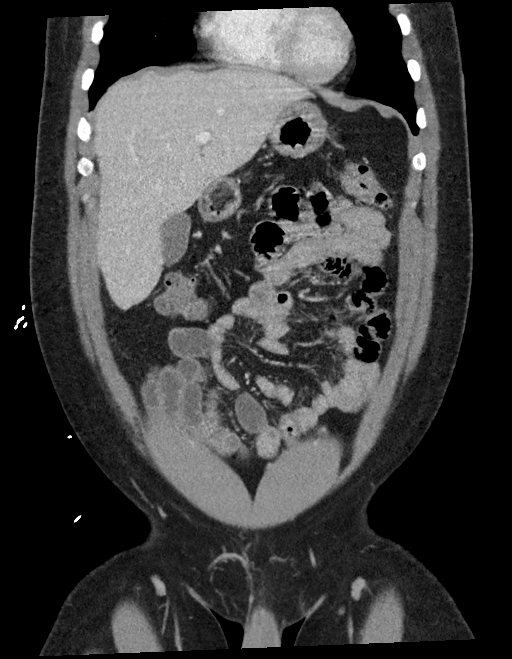
[im 42/94  soft-tissue]
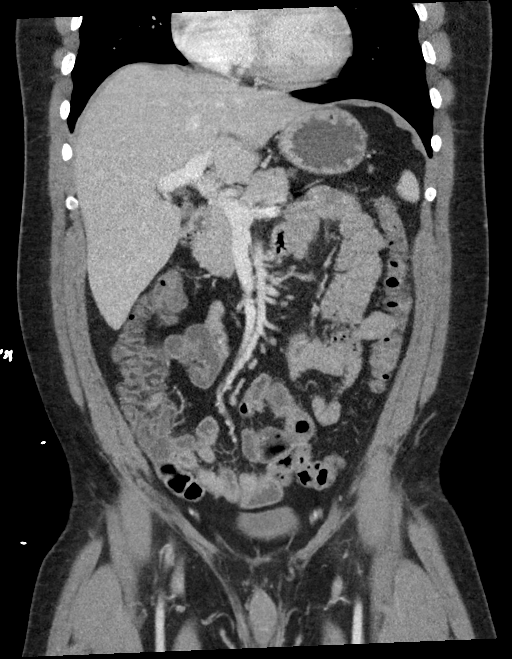
[im 52/94  soft-tissue]
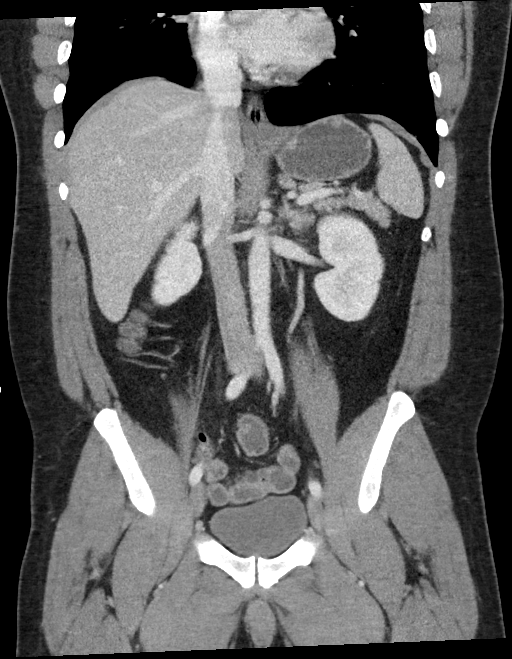

[15 of 46 positions shown; findings below may reference images not displayed]

FINDINGS: Lower chest: Cluster of nodules in the left lung base adjacent to
the hemidiaphragm measuring up to 9 mm in diameter. Some of the
nodules are calcified. These may all be granulomas.

Hepatobiliary: No focal liver abnormality is seen. No gallstones,
gallbladder wall thickening, or biliary dilatation.

Pancreas: Unremarkable. No pancreatic ductal dilatation or
surrounding inflammatory changes.

Spleen: Normal in size without focal abnormality.

Adrenals/Urinary Tract: Adrenal glands are unremarkable. Kidneys are
normal, without renal calculi, focal lesion, or hydronephrosis.
Bladder is unremarkable.

Stomach/Bowel: Stomach, small bowel, and colon are not abnormally
distended. Under distention limits evaluation of bowel wall but
there appears to be mild bowel wall thickening and hyperemia in the
distal small bowel. This could indicate enteritis. The appendix is
normal.

Vascular/Lymphatic: No significant vascular findings are present. No
enlarged abdominal or pelvic lymph nodes.

Reproductive: Prostate gland is not enlarged.

Other: No free air or free fluid in the abdomen. Periumbilical
hernia containing fat.

Musculoskeletal: No acute or significant osseous findings.
IMPRESSION: 1. Possible mild small bowel wall thickening may indicate enteritis.
Under distention limits evaluation.
2. Cluster of nodules in the left lung base measuring up to 9 mm.
Some of these are calcified and all may be granulomas. Non-contrast
chest CT at 3-6 months is recommended. If the nodules are stable at
time of repeat CT, then future CT at 18-24 months (from today's
scan) is considered optional for low-risk patients, but is
recommended for high-risk patients. This recommendation follows the
consensus statement: Guidelines for Management of Incidental
Pulmonary Nodules Detected on CT Images: From the [HOSPITAL]
3. Small periumbilical hernia containing fat.
